# Patient Record
Sex: Female | Born: 1970 | Race: White | Hispanic: No | Marital: Married | Smoking: Former smoker
Health system: Southern US, Community
[De-identification: ages and names within clinical notes are randomized; demographics above are authoritative.]

## PROBLEM LIST (undated history)

## (undated) DIAGNOSIS — T7840XA Allergy, unspecified, initial encounter: Secondary | ICD-10-CM

## (undated) DIAGNOSIS — K760 Fatty (change of) liver, not elsewhere classified: Secondary | ICD-10-CM

## (undated) DIAGNOSIS — J45909 Unspecified asthma, uncomplicated: Secondary | ICD-10-CM

## (undated) DIAGNOSIS — I10 Essential (primary) hypertension: Secondary | ICD-10-CM

## (undated) HISTORY — DX: Unspecified asthma, uncomplicated: J45.909

---

## 2000-05-03 ENCOUNTER — Other Ambulatory Visit: Admission: RE | Admit: 2000-05-03 | Discharge: 2000-05-03 | Payer: Self-pay | Admitting: *Deleted

## 2000-10-11 ENCOUNTER — Encounter: Admission: RE | Admit: 2000-10-11 | Discharge: 2000-10-11 | Payer: Self-pay | Admitting: Obstetrics and Gynecology

## 2000-10-11 ENCOUNTER — Encounter: Payer: Self-pay | Admitting: Obstetrics and Gynecology

## 2000-11-14 ENCOUNTER — Inpatient Hospital Stay (HOSPITAL_COMMUNITY): Admission: AD | Admit: 2000-11-14 | Discharge: 2000-11-17 | Payer: Self-pay | Admitting: Obstetrics and Gynecology

## 2000-11-18 ENCOUNTER — Encounter: Admission: RE | Admit: 2000-11-18 | Discharge: 2000-12-18 | Payer: Self-pay | Admitting: Obstetrics and Gynecology

## 2000-12-15 ENCOUNTER — Other Ambulatory Visit: Admission: RE | Admit: 2000-12-15 | Discharge: 2000-12-15 | Payer: Self-pay | Admitting: Obstetrics and Gynecology

## 2002-02-14 ENCOUNTER — Other Ambulatory Visit: Admission: RE | Admit: 2002-02-14 | Discharge: 2002-02-14 | Payer: Self-pay | Admitting: Obstetrics & Gynecology

## 2003-04-23 ENCOUNTER — Other Ambulatory Visit: Admission: RE | Admit: 2003-04-23 | Discharge: 2003-04-23 | Payer: Self-pay | Admitting: Obstetrics and Gynecology

## 2007-06-11 ENCOUNTER — Ambulatory Visit: Payer: Self-pay | Admitting: Family Medicine

## 2008-08-15 ENCOUNTER — Ambulatory Visit: Payer: Self-pay | Admitting: Family Medicine

## 2010-06-28 ENCOUNTER — Ambulatory Visit: Payer: Self-pay | Admitting: Cardiology

## 2010-11-29 LAB — HM PAP SMEAR: HM Pap smear: NORMAL

## 2012-11-28 ENCOUNTER — Encounter: Payer: Self-pay | Admitting: Internal Medicine

## 2012-11-28 ENCOUNTER — Ambulatory Visit (INDEPENDENT_AMBULATORY_CARE_PROVIDER_SITE_OTHER): Payer: 59 | Admitting: Internal Medicine

## 2012-11-28 VITALS — BP 132/80 | HR 110 | Temp 98.5°F | Ht 64.0 in | Wt 197.0 lb

## 2012-11-28 DIAGNOSIS — R000 Tachycardia, unspecified: Secondary | ICD-10-CM

## 2012-11-28 DIAGNOSIS — J4520 Mild intermittent asthma, uncomplicated: Secondary | ICD-10-CM

## 2012-11-28 DIAGNOSIS — J45909 Unspecified asthma, uncomplicated: Secondary | ICD-10-CM

## 2012-11-28 DIAGNOSIS — Z1239 Encounter for other screening for malignant neoplasm of breast: Secondary | ICD-10-CM

## 2012-11-28 NOTE — Assessment & Plan Note (Signed)
Tachycardia noted on exam today. EKG shows sinus tachycardia. She reports a history of menorrhagia and question whether anemia/iron deficiency may be contributing to tachycardia. Will check labs today including CBC, ferritin, TSH, CMP.

## 2012-11-28 NOTE — Assessment & Plan Note (Signed)
Symptoms well controlled with albuterol as needed. Will continue.

## 2012-11-28 NOTE — Progress Notes (Signed)
Subjective:    Patient ID: Meghan Osborne, female    DOB: 03/07/1971, 42 y.o.   MRN: [id number]  HPI 42 year old female with history of asthma presents to establish care. She reports she is generally feeling well. Her asthma is well controlled and she only rarely needs to use albuterol. She has not had any recent shortness of breath, wheezing, chest pain.  She is concerned today about rapid heart rate. She has been told by several physicians that she has an elevated heart rate. Her former PCP completed an EKG which she reports was normal. She denies any sensation of palpitations, chest pain, shortness of breath. She denies any exercise intolerance. She reports that her heart rate has been elevated, near 100 for years. No personal or family history of arrhythmia.   Outpatient Encounter Prescriptions as of 11/28/2012  Medication Sig Dispense Refill  . albuterol (PROAIR HFA) 108 (90 BASE) MCG/ACT inhaler Inhale 2 puffs into the lungs every 6 (six) hours as needed for wheezing.       No facility-administered encounter medications on file as of 11/28/2012.   BP 132/80  Pulse 110  Temp(Src) 98.5 F (36.9 C) (Oral)  Ht 5\' 4"  (1.626 m)  Wt 197 lb (89.359 kg)  BMI 33.8 kg/m2  SpO2 95%  LMP 11/14/2012  Review of Systems  Constitutional: Negative for fever, chills, appetite change, fatigue and unexpected weight change.  HENT: Negative for ear pain, congestion, sore throat, trouble swallowing, neck pain, voice change and sinus pressure.   Eyes: Negative for visual disturbance.  Respiratory: Negative for cough, shortness of breath, wheezing and stridor.   Cardiovascular: Negative for chest pain, palpitations and leg swelling.  Gastrointestinal: Negative for nausea, vomiting, abdominal pain, diarrhea, constipation, blood in stool, abdominal distention and anal bleeding.  Genitourinary: Negative for dysuria and flank pain.  Musculoskeletal: Negative for myalgias, arthralgias and gait problem.  Skin:  Negative for color change and rash.  Neurological: Negative for dizziness and headaches.  Hematological: Negative for adenopathy. Does not bruise/bleed easily.  Psychiatric/Behavioral: Negative for suicidal ideas, sleep disturbance and dysphoric mood. The patient is not nervous/anxious.        Objective:   Physical Exam  Constitutional: She is oriented to person, place, and time. She appears well-developed and well-nourished. No distress.  HENT:  Head: Normocephalic and atraumatic.  Right Ear: External ear normal.  Left Ear: External ear normal.  Nose: Nose normal.  Mouth/Throat: Oropharynx is clear and moist. No oropharyngeal exudate.  Eyes: Conjunctivae are normal. Pupils are equal, round, and reactive to light. Right eye exhibits no discharge. Left eye exhibits no discharge. No scleral icterus.  Neck: Normal range of motion. Neck supple. No tracheal deviation present. No thyromegaly present.  Cardiovascular: Regular rhythm, normal heart sounds and intact distal pulses.  Tachycardia present.  Exam reveals no gallop and no friction rub.   No murmur heard. Pulmonary/Chest: Effort normal and breath sounds normal. No respiratory distress. She has no wheezes. She has no rales. She exhibits no tenderness.  Musculoskeletal: Normal range of motion. She exhibits no edema and no tenderness.  Lymphadenopathy:    She has no cervical adenopathy.  Neurological: She is alert and oriented to person, place, and time. No cranial nerve deficit. She exhibits normal muscle tone. Coordination normal.  Skin: Skin is warm and dry. No rash noted. She is not diaphoretic. No erythema. No pallor.  Psychiatric: She has a normal mood and affect. Her behavior is normal. Judgment and thought content normal.  Assessment & Plan:

## 2012-11-29 LAB — LIPID PANEL
Cholesterol: 142 mg/dL (ref 0–200)
HDL: 43.1 mg/dL (ref >39.00)
LDL Cholesterol: 68 mg/dL (ref 0–99)
Total CHOL/HDL Ratio: 3
Triglycerides: 155 mg/dL — ABNORMAL HIGH (ref 0.0–149.0)
VLDL: 31 mg/dL (ref 0.0–40.0)

## 2012-11-29 LAB — COMPREHENSIVE METABOLIC PANEL
ALT: 22 U/L (ref 0–35)
AST: 21 U/L (ref 0–37)
Albumin: 4 g/dL (ref 3.5–5.2)
Alkaline Phosphatase: 60 U/L (ref 39–117)
BUN: 11 mg/dL (ref 6–23)
CO2: 30 mEq/L (ref 19–32)
Calcium: 9.2 mg/dL (ref 8.4–10.5)
Chloride: 103 mEq/L (ref 96–112)
Creatinine, Ser: 0.7 mg/dL (ref 0.4–1.2)
GFR: 90.96 mL/min (ref >60.00)
Glucose, Bld: 81 mg/dL (ref 70–99)
Potassium: 3.9 mEq/L (ref 3.5–5.1)
Sodium: 139 mEq/L (ref 135–145)
Total Bilirubin: 0.4 mg/dL (ref 0.3–1.2)
Total Protein: 7 g/dL (ref 6.0–8.3)

## 2012-11-29 LAB — CBC WITH DIFFERENTIAL/PLATELET
Basophils Absolute: 0 10*3/uL (ref 0.0–0.1)
Basophils Relative: 0.4 % (ref 0.0–3.0)
Eosinophils Absolute: 0.1 10*3/uL (ref 0.0–0.7)
Eosinophils Relative: 1.5 % (ref 0.0–5.0)
HCT: 38.5 % (ref 36.0–46.0)
Hemoglobin: 12.8 g/dL (ref 12.0–15.0)
Lymphocytes Relative: 31 % (ref 12.0–46.0)
Lymphs Abs: 2.9 10*3/uL (ref 0.7–4.0)
MCHC: 33.4 g/dL (ref 30.0–36.0)
MCV: 88.2 fl (ref 78.0–100.0)
Monocytes Absolute: 0.4 10*3/uL (ref 0.1–1.0)
Monocytes Relative: 4.4 % (ref 3.0–12.0)
Neutro Abs: 6 10*3/uL (ref 1.4–7.7)
Neutrophils Relative %: 62.7 % (ref 43.0–77.0)
Platelets: 219 10*3/uL (ref 150.0–400.0)
RBC: 4.36 Mil/uL (ref 3.87–5.11)
RDW: 13.1 % (ref 11.5–14.6)
WBC: 9.5 10*3/uL (ref 4.5–10.5)

## 2012-11-29 LAB — FERRITIN: Ferritin: 27.2 ng/mL (ref 10.0–291.0)

## 2012-11-29 LAB — TSH: TSH: 1.71 u[IU]/mL (ref 0.35–5.50)

## 2012-12-03 NOTE — Addendum Note (Signed)
Addended by: Ronna Polio A on: 12/03/2012 11:51 AM   Modules accepted: Orders

## 2012-12-11 ENCOUNTER — Ambulatory Visit: Payer: 59 | Admitting: Cardiovascular Disease

## 2013-01-01 ENCOUNTER — Encounter: Payer: 59 | Admitting: Physician Assistant

## 2013-02-28 ENCOUNTER — Encounter: Payer: Self-pay | Admitting: Internal Medicine

## 2013-02-28 ENCOUNTER — Other Ambulatory Visit: Payer: Self-pay

## 2013-03-05 ENCOUNTER — Ambulatory Visit: Payer: 59 | Admitting: Internal Medicine

## 2013-07-02 ENCOUNTER — Ambulatory Visit: Payer: 59 | Admitting: Internal Medicine

## 2013-12-18 ENCOUNTER — Ambulatory Visit (INDEPENDENT_AMBULATORY_CARE_PROVIDER_SITE_OTHER): Payer: 59 | Admitting: Adult Health

## 2013-12-18 ENCOUNTER — Encounter: Payer: Self-pay | Admitting: Adult Health

## 2013-12-18 VITALS — BP 109/77 | HR 108 | Temp 97.6°F | Resp 14 | Wt 196.5 lb

## 2013-12-18 DIAGNOSIS — R000 Tachycardia, unspecified: Secondary | ICD-10-CM

## 2013-12-18 DIAGNOSIS — R42 Dizziness and giddiness: Secondary | ICD-10-CM

## 2013-12-18 DIAGNOSIS — J0110 Acute frontal sinusitis, unspecified: Secondary | ICD-10-CM

## 2013-12-18 NOTE — Patient Instructions (Signed)
  Start flonase 2 sprays into each nostril daily.  Drink fluids to maintain hydration.  Do not take any decongestants.  You may try dramamine for the dizziness.  If your symptoms are not improved within 1 week please call.  I am re-referring you to see Dr. Rockey Situ for your persistent tachycardia. We will call you with an appointment.

## 2013-12-18 NOTE — Progress Notes (Signed)
Patient ID: Meghan Osborne, female   DOB: 01-12-1971, 43 y.o.   MRN: [id number]    Subjective:    Patient ID: Meghan Osborne, female    DOB: 09-Jul-1970, 43 y.o.   MRN: [id number]  HPI  Patient is a 43 year old female who presents to clinic with sinus congestion, pressure. She has been taking mucinex D but has not really noticed any improvement. She is also experiencing dizziness (room spinning) with rapid head movements and nausea. Symptoms started yesterday. No fever or chills  Past Medical History  Diagnosis Date  . Asthma     Current Outpatient Prescriptions on File Prior to Visit  Medication Sig Dispense Refill  . albuterol (PROAIR HFA) 108 (90 BASE) MCG/ACT inhaler Inhale 2 puffs into the lungs every 6 (six) hours as needed for wheezing.       No current facility-administered medications on file prior to visit.     Review of Systems  Constitutional: Negative for fever and chills.  HENT: Positive for postnasal drip and sinus pressure. Negative for rhinorrhea, sneezing and sore throat.   Respiratory: Negative.  Negative for shortness of breath.   Cardiovascular: Negative for chest pain and palpitations.  Neurological: Positive for dizziness (room spinning with rapid head movement).       Objective:  BP 109/77  Pulse 108  Temp(Src) 97.6 F (36.4 C) (Oral)  Resp 14  Wt 196 lb 8 oz (89.132 kg)  SpO2 99%   Physical Exam  Constitutional: She is oriented to person, place, and time. She appears well-developed and well-nourished. No distress.  HENT:  Head: Normocephalic and atraumatic.  Right Ear: External ear normal.  Left Ear: External ear normal.  Mouth/Throat: Oropharynx is clear and moist. No oropharyngeal exudate.  Cardiovascular: Regular rhythm and normal heart sounds.  Exam reveals no gallop.   No murmur heard. tachycardia  Pulmonary/Chest: Effort normal and breath sounds normal. No respiratory distress. She has no wheezes. She has no rales.  Lymphadenopathy:      She has no cervical adenopathy.  Neurological: She is alert and oriented to person, place, and time.  Psychiatric: She has a normal mood and affect. Her behavior is normal. Judgment and thought content normal.      Assessment & Plan:   1. Acute frontal sinusitis, recurrence not specified No indication for antibiotic at this time. Flonase 2 sprays in each nostril daily. Do not take decongestants 2/2 tachycardia. Drink plenty of fluids.  2. Tachycardia Noted pt has been tachycardic on several visit. She was referred to Dr. Rockey Situ by her PCP back in August but pt never followed up. I discussed importance of getting this evaluated. Did not hear any murmurs, gallops. She is agreeable to get this evaluated. Advised to stop the Mucinex D. - Ambulatory referral to Cardiology  3. Dizziness Suspect BPPV. She will try dramamine or other motion sickness OTC medication. Stay hydrated. Medication will cause sedation. Avoid driving. RTC if no improvement within 1 week or sooner if necessary.

## 2013-12-26 ENCOUNTER — Encounter: Payer: Self-pay | Admitting: Internal Medicine

## 2013-12-26 MED ORDER — ALBUTEROL SULFATE HFA 108 (90 BASE) MCG/ACT IN AERS: 2.0000 | INHALATION_SPRAY | Freq: Four times a day (QID) | RESPIRATORY_TRACT | Status: AC | PRN

## 2014-01-07 ENCOUNTER — Encounter: Payer: Self-pay | Admitting: Internal Medicine

## 2014-01-07 ENCOUNTER — Ambulatory Visit (INDEPENDENT_AMBULATORY_CARE_PROVIDER_SITE_OTHER)
Admission: RE | Admit: 2014-01-07 | Discharge: 2014-01-07 | Disposition: A | Discharge status: Home / Self Care | Payer: 59 | Source: Ambulatory Visit | Attending: Internal Medicine | Admitting: Internal Medicine

## 2014-01-07 ENCOUNTER — Ambulatory Visit (INDEPENDENT_AMBULATORY_CARE_PROVIDER_SITE_OTHER): Payer: 59 | Admitting: Internal Medicine

## 2014-01-07 ENCOUNTER — Other Ambulatory Visit: Payer: Self-pay | Admitting: Internal Medicine

## 2014-01-07 VITALS — BP 118/80 | HR 100 | Temp 97.9°F | Wt 194.0 lb

## 2014-01-07 DIAGNOSIS — R059 Cough, unspecified: Secondary | ICD-10-CM

## 2014-01-07 DIAGNOSIS — R05 Cough: Secondary | ICD-10-CM

## 2014-01-07 DIAGNOSIS — R0602 Shortness of breath: Secondary | ICD-10-CM

## 2014-01-07 DIAGNOSIS — R062 Wheezing: Secondary | ICD-10-CM

## 2014-01-07 MED ORDER — AZITHROMYCIN 250 MG PO TABS: ORAL_TABLET | ORAL | Status: AC

## 2014-01-07 NOTE — Progress Notes (Signed)
Pre visit review using our clinic review tool, if applicable. No additional management support is needed unless otherwise documented below in the visit note. 

## 2014-01-07 NOTE — Progress Notes (Signed)
HPI  Pt presents to the clinic today with c/o cough, chest congestion, shortness of breath, wheezing and fatigue. She reports this started 2 weeks ago. The cough is not productive. She denies fever, chills or body aches. She does have a history of asthma. She was seen at the walk in clinic 1 week ago for the same. She was given a prednisone taper, which she has finished with no relief in her symptoms. She is also taking her albuterol without any relief.  Review of Systems    Past Medical History  Diagnosis Date  . Asthma     Family History  Problem Relation Age of Onset  . Heart disease Mother   . Alcohol abuse Mother   . Diabetes Father   . Hyperlipidemia Father   . Heart disease Father   . Cancer Other     breast, paternal great grandmother    History   Social History  . Marital Status: Married    Spouse Name: N/A    Number of Children: N/A  . Years of Education: N/A   Occupational History  . Not on file.   Social History Main Topics  . Smoking status: Former Smoker    Types: Cigarettes    Quit date: 11/29/1991  . Smokeless tobacco: Never Used  . Alcohol Use: Yes     Comment: Socially  . Drug Use: No  . Sexual Activity: Not on file   Other Topics Concern  . Not on file   Social History Narrative   Lives in [place] with husband and daughter. Cat in home. 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      Work - Chemical engineer, Medical illustrator      Diet - regular      Exercise - limited    Allergies  Allergen Reactions  . Tamiflu [Oseltamivir Phosphate] Rash     Constitutional:  Denies headache, fatigue, fever or abrupt weight changes.  HEENT:  . Denies eye redness, ear pain, ringing in the ears, wax buildup, runny nose or sore throat. Respiratory: Positive cough and shortness of breath. Denies difficulty breathing.  Cardiovascular: Denies chest pain, chest tightness, palpitations or swelling in the hands or feet.   No other specific complaints in a complete review  of systems (except as listed in HPI above).  Objective:   BP 118/80  Pulse 100  Temp(Src) 97.9 F (36.6 C) (Oral)  Wt 194 lb (87.998 kg)  SpO2 96%  General: Appears her stated age, well developed, well nourished in NAD. HEENT: Ears: Tm's gray and intact, normal light reflex; Nose: mucosa pink and moist, septum midline; Throat/Mouth: Teeth present, mucosa pink and moist, no exudate noted, no lesions or ulcerations noted.   Cardiovascular: Tachycardic with normal rhythm. S1,S2 noted.  No murmur, rubs or gallops noted. No BLE edema.  Pulmonary/Chest: Normal effort and crackles noted bilaterally. No respiratory distress. No wheezes r ronchi noted.      Assessment & Plan:   Cough, wheezing and shortness of breath:  ? Fluid versus infiltrate (however, no fever, no colored mucous, no BLE) Will check chest xray If fluid- will treat with diuretic If infiltrate- will treat with Levaquin If normal- no intervention needed  Will call you after chest xray is back with results and comprehensive plan

## 2014-01-07 NOTE — Patient Instructions (Addendum)
Cough, Adult  A cough is a reflex that helps clear your throat and airways. It can help heal the body or may be a reaction to an irritated airway. A cough may only last 2 or 3 weeks (acute) or may last more than 8 weeks (chronic).  CAUSES Acute cough:  Viral or bacterial infections. Chronic cough:  Infections.  Allergies.  Asthma.  Post-nasal drip.  Smoking.  Heartburn or acid reflux.  Some medicines.  Chronic lung problems (COPD).  Cancer. SYMPTOMS   Cough.  Fever.  Chest pain.  Increased breathing rate.  High-pitched whistling sound when breathing (wheezing).  Colored mucus that you cough up (sputum). TREATMENT   A bacterial cough may be treated with antibiotic medicine.  A viral cough must run its course and will not respond to antibiotics.  Your caregiver may recommend other treatments if you have a chronic cough. HOME CARE INSTRUCTIONS   Only take over-the-counter or prescription medicines for pain, discomfort, or fever as directed by your caregiver. Use cough suppressants only as directed by your caregiver.  Use a cold steam vaporizer or humidifier in your bedroom or home to help loosen secretions.  Sleep in a semi-upright position if your cough is worse at night.  Rest as needed.  Stop smoking if you smoke. SEEK IMMEDIATE MEDICAL CARE IF:   You have pus in your sputum.  Your cough starts to worsen.  You cannot control your cough with suppressants and are losing sleep.  You begin coughing up blood.  You have difficulty breathing.  You develop pain which is getting worse or is uncontrolled with medicine.  You have a fever. MAKE SURE YOU:   Understand these instructions.  Will watch your condition.  Will get help right away if you are not doing well or get worse. Document Released: 10/08/2010 Document Revised: 07/04/2011 Document Reviewed: 10/08/2010 Exit[employer] Patient Information 2015 [employer]. This information is not intended  to replace advice given to you by your health care provider. Make sure you discuss any questions you have with your health care provider.  

## 2014-01-17 ENCOUNTER — Ambulatory Visit: Payer: 59 | Admitting: Cardiovascular Disease

## 2014-01-29 ENCOUNTER — Ambulatory Visit: Payer: 59 | Admitting: Cardiovascular Disease

## 2019-06-28 ENCOUNTER — Other Ambulatory Visit: Payer: Self-pay

## 2019-06-28 ENCOUNTER — Emergency Department
Admission: EM | Admit: 2019-06-28 | Discharge: 2019-06-28 | Disposition: A | Discharge status: Home / Self Care | Payer: BC Managed Care – PPO | Attending: Emergency Medicine | Admitting: Emergency Medicine

## 2019-06-28 DIAGNOSIS — J45909 Unspecified asthma, uncomplicated: Secondary | ICD-10-CM | POA: Diagnosis not present

## 2019-06-28 DIAGNOSIS — R109 Unspecified abdominal pain: Secondary | ICD-10-CM

## 2019-06-28 DIAGNOSIS — Z87891 Personal history of nicotine dependence: Secondary | ICD-10-CM | POA: Diagnosis not present

## 2019-06-28 DIAGNOSIS — Z79899 Other long term (current) drug therapy: Secondary | ICD-10-CM | POA: Diagnosis not present

## 2019-06-28 HISTORY — DX: Allergy, unspecified, initial encounter: T78.40XA

## 2019-06-28 LAB — URINALYSIS, COMPLETE (UACMP) WITH MICROSCOPIC
Bacteria, UA: NONE SEEN
Bilirubin Urine: NEGATIVE
Glucose, UA: NEGATIVE mg/dL
Ketones, ur: NEGATIVE mg/dL
Leukocytes,Ua: NEGATIVE
Nitrite: NEGATIVE
Protein, ur: 30 mg/dL — AB
RBC / HPF: >50 RBC/hpf — ABNORMAL HIGH (ref 0–5)
Specific Gravity, Urine: 1.018 (ref 1.005–1.030)
pH: 5 (ref 5.0–8.0)

## 2019-06-28 LAB — COMPREHENSIVE METABOLIC PANEL
ALT: 55 U/L — ABNORMAL HIGH (ref 0–44)
AST: 56 U/L — ABNORMAL HIGH (ref 15–41)
Albumin: 4.5 g/dL (ref 3.5–5.0)
Alkaline Phosphatase: 62 U/L (ref 38–126)
Anion gap: 8 (ref 5–15)
BUN: 15 mg/dL (ref 6–20)
CO2: 26 mmol/L (ref 22–32)
Calcium: 10 mg/dL (ref 8.9–10.3)
Chloride: 101 mmol/L (ref 98–111)
Creatinine, Ser: 0.72 mg/dL (ref 0.44–1.00)
GFR calc Af Amer: >60 mL/min (ref >60)
GFR calc non Af Amer: >60 mL/min (ref >60)
Glucose, Bld: 105 mg/dL — ABNORMAL HIGH (ref 70–99)
Potassium: 3.8 mmol/L (ref 3.5–5.1)
Sodium: 135 mmol/L (ref 135–145)
Total Bilirubin: 0.6 mg/dL (ref 0.3–1.2)
Total Protein: 7.9 g/dL (ref 6.5–8.1)

## 2019-06-28 LAB — CBC WITH DIFFERENTIAL/PLATELET
Abs Immature Granulocytes: 0.02 10*3/uL (ref 0.00–0.07)
Basophils Absolute: 0 10*3/uL (ref 0.0–0.1)
Basophils Relative: 0 %
Eosinophils Absolute: 0.1 10*3/uL (ref 0.0–0.5)
Eosinophils Relative: 2 %
HCT: 42.3 % (ref 36.0–46.0)
Hemoglobin: 13.9 g/dL (ref 12.0–15.0)
Immature Granulocytes: 0 %
Lymphocytes Relative: 27 %
Lymphs Abs: 2.1 10*3/uL (ref 0.7–4.0)
MCH: 28.1 pg (ref 26.0–34.0)
MCHC: 32.9 g/dL (ref 30.0–36.0)
MCV: 85.6 fL (ref 80.0–100.0)
Monocytes Absolute: 0.4 10*3/uL (ref 0.1–1.0)
Monocytes Relative: 4 %
Neutro Abs: 5.4 10*3/uL (ref 1.7–7.7)
Neutrophils Relative %: 67 %
Platelets: 243 10*3/uL (ref 150–400)
RBC: 4.94 MIL/uL (ref 3.87–5.11)
RDW: 13.6 % (ref 11.5–15.5)
WBC: 8 10*3/uL (ref 4.0–10.5)
nRBC: 0 % (ref 0.0–0.2)

## 2019-06-28 LAB — LIPASE, BLOOD: Lipase: 46 U/L (ref 11–51)

## 2019-06-28 MED ORDER — FAMOTIDINE 20 MG PO TABS: 20.0000 mg | ORAL_TABLET | Freq: Every day | ORAL | 1 refills | Status: AC

## 2019-06-28 MED ORDER — LIDOCAINE VISCOUS HCL 2 % MT SOLN
15.0000 mL | Freq: Once | OROMUCOSAL | Status: AC
  Administered 2019-06-28: 15 mL via OROMUCOSAL
  Filled 2019-06-28: qty 15

## 2019-06-28 MED ORDER — SUCRALFATE 1 G PO TABS: 1.0000 g | ORAL_TABLET | Freq: Four times a day (QID) | ORAL | 0 refills | Status: AC

## 2019-06-28 NOTE — ED Triage Notes (Signed)
Pt to the er for abd pain and back pain that started last night that pt described as menstrual cramps. Pt has not had a period in 6 months. Pt states the pain has worsened today. Pt reports pain and burning with urination.

## 2019-06-28 NOTE — ED Notes (Signed)
Pt to ED c/o mid abdominal pain that radiated to flank at 1500 today. Denies chest pain, SHOB, dizziness.  Denies pain or burning with urination at this time.  Denies fevers, N/V/D Pt in NAD at this time

## 2019-06-28 NOTE — Discharge Instructions (Addendum)
As we discussed please follow up with your doctor. If you do not develop your period in the next day or two it would be important that you get close follow up for the blood seen in your urine today. Please seek medical attention for any high fevers, chest pain, shortness of breath, change in behavior, persistent vomiting, bloody stool or any other new or concerning symptoms.

## 2019-06-28 NOTE — ED Provider Notes (Signed)
[facility] Emergency Department Provider Note   ____________________________________________   I have reviewed the triage vital signs and the nursing notes.   HISTORY  Chief Complaint Abdominal Pain   History limited by: Not Limited   HPI Meghan Osborne is a 49 y.o. female who presents to the emergency department today because of concern for abdominal pain. The pain started today around 3 pm while she was sitting down at work. She says that the pain was located somewhat centrally and in the lower abdomen. The pain was severe. It was accompanied by some back pain. She denies any significant nausea although when the pain got very severe she did feel like she might have to vomit. The patient denies similar pain in the past. By the time of my exam the pain had eased off significantly although she still had some burning in the upper mid abdomen. Denies any fevers. Denies any unusual ingestions.   Records reviewed. Per medical record review patient has a history of asthma.   Past Medical History:  Diagnosis Date  . Allergies   . Asthma     Patient Active Problem List   Diagnosis Date Noted  . Tachycardia 11/28/2012  . Screening for breast cancer 11/28/2012  . Asthma, chronic 11/28/2012    Past Surgical History:  Procedure Laterality Date  . VAGINAL DELIVERY     2    Prior to Admission medications   Medication Sig Start Date End Date Taking? Authorizing Provider  albuterol (PROAIR HFA) 108 (90 BASE) MCG/ACT inhaler Inhale 2 puffs into the lungs every 6 (six) hours as needed for wheezing. 12/26/13   Jackolyn Confer, MD  azithromycin (ZITHROMAX) 250 MG tablet Take 2 tablets today, then 1 tablet daily for 4 days 01/07/14   Jearld Fenton, NP    Allergies Oseltamivir and Tamiflu [oseltamivir phosphate]  Family History  Problem Relation Age of Onset  . Heart disease Mother   . Alcohol abuse Mother   . Diabetes Father   . Hyperlipidemia Father   .  Heart disease Father   . Cancer Other        breast, paternal great grandmother    Social History Social History   Tobacco Use  . Smoking status: Former Smoker    Types: Cigarettes    Quit date: 11/29/1991    Years since quitting: 27.5  . Smokeless tobacco: Never Used  Substance Use Topics  . Alcohol use: Yes    Comment: Socially  . Drug use: No    Review of Systems Constitutional: No fever/chills Eyes: No visual changes. ENT: No sore throat. Cardiovascular: Denies chest pain. Respiratory: Denies shortness of breath. Gastrointestinal: Positive for abdominal pain. Genitourinary: Negative for dysuria. Musculoskeletal: Negative for back pain. Skin: Negative for rash. Neurological: Negative for headaches, focal weakness or numbness.  ____________________________________________   PHYSICAL EXAM:  VITAL SIGNS: ED Triage Vitals  Enc Vitals Group     BP 06/28/19 1801 (!) 135/95     Pulse Rate 06/28/19 1801 86     Resp 06/28/19 1801 18     Temp 06/28/19 1801 97.7 F (36.5 C)     Temp Source 06/28/19 1801 Oral     SpO2 06/28/19 1801 98 %     Weight 06/28/19 1829 208 lb (94.3 kg)     Height 06/28/19 1829 5\' 4"  (1.626 m)     Head Circumference --      Peak Flow --      Pain Score 06/28/19  1829 2   Constitutional: Alert and oriented.  Eyes: Conjunctivae are normal.  ENT      Head: Normocephalic and atraumatic.      Nose: No congestion/rhinnorhea.      Mouth/Throat: Mucous membranes are moist.      Neck: No stridor. Hematological/Lymphatic/Immunilogical: No cervical lymphadenopathy. Cardiovascular: Normal rate, regular rhythm.  No murmurs, rubs, or gallops.  Respiratory: Normal respiratory effort without tachypnea nor retractions. Breath sounds are clear and equal bilaterally. No wheezes/rales/rhonchi. Gastrointestinal: Soft and non tender. No rebound. No guarding.  Genitourinary: Deferred Musculoskeletal: Normal range of motion in all extremities. No lower extremity  edema. Neurologic:  Normal speech and language. No gross focal neurologic deficits are appreciated.  Skin:  Skin is warm, dry and intact. No rash noted. Psychiatric: Mood and affect are normal. Speech and behavior are normal. Patient exhibits appropriate insight and judgment.  ____________________________________________    LABS (pertinent positives/negatives)  CBC wbc 8.0, hgb 13.9, plt 243 UA hazy, large hgb dipstick, >50 rbc, 0-5 wbc Lipase 46 CMP wnl except glu 105, ast 56, alt 55 ____________________________________________   EKG  I, Nance Pear, attending physician, personally viewed and interpreted this EKG  EKG Time: 1821 Rate: 88 Rhythm: normal sinus rhythm Axis: left axis deviation Intervals: qtc 467 QRS: narrow ST changes: no st elevation Impression: abnormal ekg   ____________________________________________    RADIOLOGY  None  ____________________________________________   PROCEDURES  Procedures  ____________________________________________   INITIAL IMPRESSION / ASSESSMENT AND PLAN / ED COURSE  Pertinent labs & imaging results that were available during my care of the patient were reviewed by me and considered in my medical decision making (see chart for details).   Patient presented to the emergency department today because of concerns for abdominal pain.  Patient's abdominal exam without any significant tenderness.  Work-up without any leukocytosis.  Urine did have red blood cells in it.  I discussed this with the patient.  She is unsure if she might be beginning to start her period and that this could have been some cramping.  She states that her periods are now irregular.  I do not think the patient's clinical history is consistent with kidney stone.  I did discuss with patient possibility of obtaining imaging however she was comfortable deferring.  We did discuss that if her pain does not improve she will need to follow-up with primary care  to reevaluate.  Also discussed if symptoms worsen to come back to the emergency department.  She did get some relief with GI cocktail so will treat for gastritis.  ___________________________________________   FINAL CLINICAL IMPRESSION(S) / ED DIAGNOSES  Final diagnoses:  Abdominal pain, unspecified abdominal location     Note: This dictation was prepared with Dragon dictation. Any transcriptional errors that result from this process are unintentional     Nance Pear, MD 06/28/19 2340

## 2019-07-09 ENCOUNTER — Other Ambulatory Visit: Payer: Self-pay | Admitting: Internal Medicine

## 2019-07-09 DIAGNOSIS — R1084 Generalized abdominal pain: Secondary | ICD-10-CM

## 2019-07-15 ENCOUNTER — Other Ambulatory Visit: Payer: Self-pay

## 2019-07-15 ENCOUNTER — Ambulatory Visit
Admission: RE | Admit: 2019-07-15 | Discharge: 2019-07-15 | Disposition: A | Discharge status: Home / Self Care | Payer: BC Managed Care – PPO | Source: Ambulatory Visit | Attending: Internal Medicine | Admitting: Internal Medicine

## 2019-07-15 DIAGNOSIS — R1084 Generalized abdominal pain: Secondary | ICD-10-CM

## 2019-07-17 ENCOUNTER — Other Ambulatory Visit: Payer: Self-pay

## 2019-07-17 ENCOUNTER — Ambulatory Visit
Admission: RE | Admit: 2019-07-17 | Discharge: 2019-07-17 | Disposition: A | Discharge status: Home / Self Care | Payer: BC Managed Care – PPO | Source: Ambulatory Visit | Attending: Internal Medicine | Admitting: Internal Medicine

## 2019-07-17 DIAGNOSIS — R1084 Generalized abdominal pain: Secondary | ICD-10-CM | POA: Insufficient documentation

## 2020-04-02 ENCOUNTER — Other Ambulatory Visit: Payer: Self-pay

## 2020-04-02 ENCOUNTER — Other Ambulatory Visit: Payer: Self-pay | Admitting: Family Medicine

## 2020-04-02 ENCOUNTER — Ambulatory Visit
Admission: RE | Admit: 2020-04-02 | Discharge: 2020-04-02 | Disposition: A | Discharge status: Home / Self Care | Payer: BC Managed Care – PPO | Source: Ambulatory Visit | Attending: Family Medicine | Admitting: Family Medicine

## 2020-04-02 DIAGNOSIS — R1032 Left lower quadrant pain: Secondary | ICD-10-CM | POA: Insufficient documentation

## 2020-04-02 MED ORDER — IOHEXOL 300 MG/ML  SOLN
100.0000 mL | Freq: Once | INTRAMUSCULAR | Status: AC | PRN
  Administered 2020-04-02: 100 mL via INTRAVENOUS

## 2020-05-09 ENCOUNTER — Other Ambulatory Visit: Payer: Self-pay

## 2020-05-09 DIAGNOSIS — Z20822 Contact with and (suspected) exposure to covid-19: Secondary | ICD-10-CM

## 2020-05-12 LAB — NOVEL CORONAVIRUS, NAA: SARS-CoV-2, NAA: DETECTED — AB

## 2020-08-12 ENCOUNTER — Other Ambulatory Visit: Payer: Self-pay

## 2020-08-12 ENCOUNTER — Ambulatory Visit: Payer: BC Managed Care – PPO | Admitting: Gastroenterology

## 2020-08-12 ENCOUNTER — Encounter: Payer: Self-pay | Admitting: Gastroenterology

## 2020-08-12 VITALS — BP 115/84 | HR 92 | Ht 63.5 in | Wt 217.0 lb

## 2020-08-12 DIAGNOSIS — Z8719 Personal history of other diseases of the digestive system: Secondary | ICD-10-CM

## 2020-08-12 MED ORDER — PEG 3350-KCL-NA BICARB-NACL 420 G PO SOLR: 4000.0000 mL | Freq: Once | ORAL | 0 refills | Status: AC

## 2020-08-12 NOTE — Progress Notes (Signed)
Procedure has been scheduled and instructions sent via my chart and mailed. Bowel prep sent to the pharmacy.

## 2020-08-12 NOTE — Progress Notes (Signed)
Jonathon Bellows MD, MRCP(U.K) [address]  [place]  [place], [place] [address]  Main: [phone]  Fax: [phone]   Gastroenterology Consultation  Referring Provider:     Idelle Crouch, MD Primary Care Physician:  Idelle Crouch, MD Primary Gastroenterologist:  Dr. Jonathon Bellows  Reason for Consultation:     Diverticulosis of the colon        HPI:   Meghan Osborne is a 50 y.o. y/o female referred for consultation & management  by Dr. Idelle Crouch, MD.   December 2020 when she had presented with symptoms suggestive of diverticulitis which was confirmed on CT scan as below.  Treated with ciprofloxacin and Flagyl and her symptoms of abdominal discomfort gradually resolved over 3 months.  Presently has no GI symptoms.  No abdominal pain.  Never had a colonoscopy.  No family history of colon cancer or polyps.  No change in the shape of her stool.  No rectal bleeding.  No unintentional weight loss.  04/02/2020:  1. Moderate acute sigmoid colon diverticulitis. No extraluminal gas or abscess.2. 2 mm right mid kidney nonobstructive renal calculus. 3. Hypodense lesion in the left kidney lower pole is probably a cyst but technically too small to characterize.    Past Medical History:  Diagnosis Date  . Allergies   . Asthma     Past Surgical History:  Procedure Laterality Date  . VAGINAL DELIVERY     2    Prior to Admission medications   Medication Sig Start Date End Date Taking? Authorizing Provider  albuterol (PROAIR HFA) 108 (90 BASE) MCG/ACT inhaler Inhale 2 puffs into the lungs every 6 (six) hours as needed for wheezing. 12/26/13   Jackolyn Confer, MD  azithromycin (ZITHROMAX) 250 MG tablet Take 2 tablets today, then 1 tablet daily for 4 days 01/07/14   Jearld Fenton, NP  famotidine (PEPCID) 20 MG tablet Take 1 tablet (20 mg total) by mouth daily. 06/28/19 06/27/20  Nance Pear, MD  sucralfate (CARAFATE) 1 g tablet Take 1 tablet (1 g total) by mouth 4  (four) times daily for 10 days. 06/28/19 07/08/19  Nance Pear, MD    Family History  Problem Relation Age of Onset  . Heart disease Mother   . Alcohol abuse Mother   . Diabetes Father   . Hyperlipidemia Father   . Heart disease Father   . Cancer Other        breast, paternal great grandmother     Social History   Tobacco Use  . Smoking status: Former Smoker    Types: Cigarettes    Quit date: 11/29/1991    Years since quitting: 28.7  . Smokeless tobacco: Never Used  Substance Use Topics  . Alcohol use: Yes    Comment: Socially  . Drug use: No    Allergies as of 08/12/2020 - Review Complete 08/12/2020  Allergen Reaction Noted  . Oseltamivir Rash 04/12/2016  . Tamiflu [oseltamivir phosphate] Rash 11/28/2012    Review of Systems:    All systems reviewed and negative except where noted in HPI.   Physical Exam:  BP 115/84 (BP Location: Left Arm, Patient Position: Sitting, Cuff Size: Large)   Pulse 92   Ht 5' 3.5" (1.613 m)   Wt 217 lb (98.4 kg)   BMI 37.84 kg/m  No LMP recorded. Psych:  Alert and cooperative. Normal mood and affect. General:   Alert,  Well-developed, well-nourished, pleasant and cooperative in NAD Head:  Normocephalic and  atraumatic. Eyes:  Sclera clear, no icterus.   Conjunctiva pink. Ears:  Normal auditory acuity. Lungs:  Respirations even and unlabored.  Clear throughout to auscultation.   No wheezes, crackles, or rhonchi. No acute distress. Heart:  Regular rate and rhythm; no murmurs, clicks, rubs, or gallops. Abdomen:  Normal bowel sounds.  No bruits.  Soft, non-tender and non-distended without masses, hepatosplenomegaly or hernias noted.  No guarding or rebound tenderness.    Neurologic:  Alert and oriented x3;  grossly normal neurologically. Psych:  Alert and cooperative. Normal mood and affect.  Imaging Studies: No results found.  Assessment and Plan:   Meghan Osborne is a 50 y.o. y/o female has been referred for diverticulosis of the  colon.  She has a history of diverticulitis seen on CT scan in December 2020 one of the sigmoid colon.  She would require a colonoscopy to rule out any intraluminal lesions which could have triggered the diverticulitis.  Presently has no GI symptoms.  Plan  1. Diagnostic colonoscopy  2.  Patient information on diverticulosis provided.  Suggest high-fiber diet.  Previously diet devoid of seeds and nuts recommended but evidence subsequently has shown that there is no correlation between prevention of diverticulitis or onset of diverticulitis with consumption of seeds and nuts.  If anything there is probably a protective benefit.  I have discussed alternative options, risks & benefits,  which include, but are not limited to, bleeding, infection, perforation,respiratory complication & drug reaction.  The patient agrees with this plan & written consent will be obtained.     Follow up in as needed   Dr Jonathon Bellows MD,MRCP(U.K)

## 2020-08-12 NOTE — Patient Instructions (Signed)
Diverticulitis  Diverticulitis is when small pouches in your colon (large intestine) get infected or swollen. This causes pain in the belly (abdomen) and watery poop (diarrhea). These pouches are called diverticula. The pouches form in people who have a condition called diverticulosis. What are the causes? This condition may be caused by poop (stool) that gets trapped in the pouches in your colon. The poop lets germs (bacteria) grow in the pouches. This causes the infection. What increases the risk? You are more likely to get this condition if you have small pouches in your colon. The risk is higher if:  You are overweight or very overweight (obese).  You do not exercise enough.  You drink alcohol.  You smoke or use products with tobacco in them.  You eat a diet that has a lot of red meat such as beef, pork, or lamb.  You eat a diet that does not have enough fiber in it.  You are older than 51 years of age. What are the signs or symptoms?  Pain in the belly. Pain is often on the left side, but it may be in other areas.  Fever and feeling cold.  Feeling like you may vomit.  Vomiting.  Having cramps.  Feeling full.  Changes to how often you poop.  Blood in your poop. How is this treated? Most cases are treated at home by:  Taking over-the-counter pain medicines.  Following a clear liquid diet.  Taking antibiotic medicines.  Resting. Very bad cases may need to be treated at a hospital. This may include:  Not eating or drinking.  Taking prescription pain medicine.  Getting antibiotic medicines through an IV tube.  Getting fluid and food through an IV tube.  Having surgery. When you are feeling better, your doctor may tell you to have a test to check your colon (colonoscopy). Follow these instructions at home: Medicines  Take over-the-counter and prescription medicines only as told by your doctor. These include: ? Antibiotics. ? Pain medicines. ? Fiber  pills. ? Probiotics. ? Stool softeners.  If you were prescribed an antibiotic medicine, take it as told by your doctor. Do not stop taking the antibiotic even if you start to feel better.  Ask your doctor if the medicine prescribed to you requires you to avoid driving or using machinery. Eating and drinking  Follow a diet as told by your doctor.  When you feel better, your doctor may tell you to change your diet. You may need to eat a lot of fiber. Fiber makes it easier to poop (have a bowel movement). Foods with fiber include: ? Berries. ? Beans. ? Lentils. ? Green vegetables.  Avoid eating red meat.   General instructions  Do not use any products that contain nicotine or tobacco, such as cigarettes, e-cigarettes, and chewing tobacco. If you need help quitting, ask your doctor.  Exercise 3 or more times a week. Try to get 30 minutes each time. Exercise enough to sweat and make your heart beat faster.  Keep all follow-up visits as told by your doctor. This is important. Contact a doctor if:  Your pain does not get better.  You are not pooping like normal. Get help right away if:  Your pain gets worse.  Your symptoms do not get better.  Your symptoms get worse very fast.  You have a fever.  You vomit more than one time.  You have poop that is: ? Bloody. ? Black. ? Tarry. Summary  This condition happens when   small pouches in your colon get infected or swollen.  Take medicines only as told by your doctor.  Follow a diet as told by your doctor.  Keep all follow-up visits as told by your doctor. This is important. This information is not intended to replace advice given to you by your health care provider. Make sure you discuss any questions you have with your health care provider. Document Revised: 01/21/2019 Document Reviewed: 01/21/2019 Elsevier Patient Education  2021 Elsevier Inc.  

## 2020-08-28 ENCOUNTER — Encounter
Admission: RE | Disposition: A | Discharge status: Home / Self Care | Payer: Self-pay | Source: Home / Self Care | Attending: Gastroenterology

## 2020-08-28 ENCOUNTER — Ambulatory Visit: Payer: BC Managed Care – PPO | Admitting: Certified Registered"

## 2020-08-28 ENCOUNTER — Encounter: Payer: Self-pay | Admitting: Gastroenterology

## 2020-08-28 ENCOUNTER — Other Ambulatory Visit: Payer: Self-pay

## 2020-08-28 ENCOUNTER — Ambulatory Visit
Admission: RE | Admit: 2020-08-28 | Discharge: 2020-08-28 | Disposition: A | Discharge status: Home / Self Care | Payer: BC Managed Care – PPO | Attending: Gastroenterology | Admitting: Gastroenterology

## 2020-08-28 DIAGNOSIS — K635 Polyp of colon: Secondary | ICD-10-CM

## 2020-08-28 DIAGNOSIS — D122 Benign neoplasm of ascending colon: Secondary | ICD-10-CM | POA: Insufficient documentation

## 2020-08-28 DIAGNOSIS — Z87891 Personal history of nicotine dependence: Secondary | ICD-10-CM | POA: Diagnosis not present

## 2020-08-28 DIAGNOSIS — Z833 Family history of diabetes mellitus: Secondary | ICD-10-CM | POA: Insufficient documentation

## 2020-08-28 DIAGNOSIS — Z8349 Family history of other endocrine, nutritional and metabolic diseases: Secondary | ICD-10-CM | POA: Insufficient documentation

## 2020-08-28 DIAGNOSIS — Z887 Allergy status to serum and vaccine status: Secondary | ICD-10-CM | POA: Diagnosis not present

## 2020-08-28 DIAGNOSIS — Z8249 Family history of ischemic heart disease and other diseases of the circulatory system: Secondary | ICD-10-CM | POA: Diagnosis not present

## 2020-08-28 DIAGNOSIS — K5730 Diverticulosis of large intestine without perforation or abscess without bleeding: Secondary | ICD-10-CM | POA: Diagnosis not present

## 2020-08-28 DIAGNOSIS — D124 Benign neoplasm of descending colon: Secondary | ICD-10-CM | POA: Diagnosis not present

## 2020-08-28 DIAGNOSIS — Z8719 Personal history of other diseases of the digestive system: Secondary | ICD-10-CM

## 2020-08-28 DIAGNOSIS — K640 First degree hemorrhoids: Secondary | ICD-10-CM | POA: Diagnosis not present

## 2020-08-28 DIAGNOSIS — Z888 Allergy status to other drugs, medicaments and biological substances status: Secondary | ICD-10-CM | POA: Insufficient documentation

## 2020-08-28 DIAGNOSIS — Z803 Family history of malignant neoplasm of breast: Secondary | ICD-10-CM | POA: Diagnosis not present

## 2020-08-28 DIAGNOSIS — I10 Essential (primary) hypertension: Secondary | ICD-10-CM | POA: Diagnosis not present

## 2020-08-28 DIAGNOSIS — Z79899 Other long term (current) drug therapy: Secondary | ICD-10-CM | POA: Diagnosis not present

## 2020-08-28 DIAGNOSIS — Z7951 Long term (current) use of inhaled steroids: Secondary | ICD-10-CM | POA: Insufficient documentation

## 2020-08-28 HISTORY — PX: COLONOSCOPY WITH PROPOFOL: SHX5780

## 2020-08-28 HISTORY — DX: Essential (primary) hypertension: I10

## 2020-08-28 HISTORY — DX: Fatty (change of) liver, not elsewhere classified: K76.0

## 2020-08-28 LAB — POCT PREGNANCY, URINE: Preg Test, Ur: NEGATIVE

## 2020-08-28 SURGERY — COLONOSCOPY WITH PROPOFOL
Anesthesia: General

## 2020-08-28 MED ORDER — SODIUM CHLORIDE 0.9 % IV SOLN
INTRAVENOUS | Status: DC
Start: 2020-08-28 — End: 2020-08-28

## 2020-08-28 MED ORDER — PROPOFOL 10 MG/ML IV BOLUS
INTRAVENOUS | Status: DC | PRN
  Administered 2020-08-28: 20 mg via INTRAVENOUS
  Administered 2020-08-28: 30 mg via INTRAVENOUS
  Administered 2020-08-28: 70 mg via INTRAVENOUS
  Administered 2020-08-28: 30 mg via INTRAVENOUS

## 2020-08-28 MED ORDER — PROPOFOL 500 MG/50ML IV EMUL
INTRAVENOUS | Status: AC
  Filled 2020-08-28: qty 50

## 2020-08-28 MED ORDER — LIDOCAINE HCL (PF) 2 % IJ SOLN
INTRAMUSCULAR | Status: AC
  Filled 2020-08-28: qty 5

## 2020-08-28 MED ORDER — LIDOCAINE HCL (CARDIAC) PF 100 MG/5ML IV SOSY
PREFILLED_SYRINGE | INTRAVENOUS | Status: DC | PRN
  Administered 2020-08-28: 50 mg via INTRAVENOUS

## 2020-08-28 MED ORDER — PROPOFOL 10 MG/ML IV BOLUS
INTRAVENOUS | Status: AC
  Filled 2020-08-28: qty 20

## 2020-08-28 MED ORDER — PROPOFOL 500 MG/50ML IV EMUL
INTRAVENOUS | Status: DC | PRN
  Administered 2020-08-28: 150 ug/kg/min via INTRAVENOUS

## 2020-08-28 NOTE — Anesthesia Procedure Notes (Signed)
Procedure Name: MAC Date/Time: 08/28/2020 8:14 AM Performed by: Jerrye Noble, CRNA Pre-anesthesia Checklist: Patient identified, Emergency Drugs available, Suction available and Patient being monitored Patient Re-evaluated:Patient Re-evaluated prior to induction Oxygen Delivery Method: Nasal cannula

## 2020-08-28 NOTE — Anesthesia Postprocedure Evaluation (Signed)
Anesthesia Post Note  Patient: Meghan Osborne  Procedure(s) Performed: COLONOSCOPY WITH PROPOFOL (N/A )  Patient location during evaluation: PACU Anesthesia Type: General Level of consciousness: awake and alert Pain management: pain level controlled Vital Signs Assessment: post-procedure vital signs reviewed and stable Respiratory status: spontaneous breathing, nonlabored ventilation, respiratory function stable and patient connected to nasal cannula oxygen Cardiovascular status: blood pressure returned to baseline and stable Postop Assessment: no apparent nausea or vomiting Anesthetic complications: no   No complications documented.   Last Vitals:  Vitals:   08/28/20 0847 08/28/20 0857  BP: 103/67 110/70  Pulse: 90 80  Resp: (!) 29 (!) 22  Temp:    SpO2: 98% 98%    Last Pain:  Vitals:   08/28/20 0857  TempSrc:   PainSc: 0-No pain                 Molli Barrows

## 2020-08-28 NOTE — H&P (Signed)
Jonathon Bellows, MD [address] [place], [place], [place], [address] [address], [place], [place], [place], [address] Phone: [phone]  Fax: [phone]  Primary Care Physician:  Idelle Crouch, MD   Pre-Procedure History & Physical: HPI:  Meghan Osborne is a 50 y.o. female is here for an colonoscopy.   Past Medical History:  Diagnosis Date  . Allergies   . Asthma   . Fatty liver   . Hypertension     Past Surgical History:  Procedure Laterality Date  . VAGINAL DELIVERY     2    Prior to Admission medications   Medication Sig Start Date End Date Taking? Authorizing Provider  albuterol (PROAIR HFA) 108 (90 BASE) MCG/ACT inhaler Inhale 2 puffs into the lungs every 6 (six) hours as needed for wheezing. 12/26/13  Yes Jackolyn Confer, MD  azithromycin (ZITHROMAX) 250 MG tablet Take 2 tablets today, then 1 tablet daily for 4 days Patient not taking: No sig reported 01/07/14   Jearld Fenton, NP  famotidine (PEPCID) 20 MG tablet Take 1 tablet (20 mg total) by mouth daily. 06/28/19 06/27/20  Nance Pear, MD  sucralfate (CARAFATE) 1 g tablet Take 1 tablet (1 g total) by mouth 4 (four) times daily for 10 days. 06/28/19 07/08/19  Nance Pear, MD    Allergies as of 08/13/2020 - Review Complete 08/12/2020  Allergen Reaction Noted  . Oseltamivir Rash 04/12/2016  . Tamiflu [oseltamivir phosphate] Rash 11/28/2012    Family History  Problem Relation Age of Onset  . Heart disease Mother   . Alcohol abuse Mother   . Diabetes Father   . Hyperlipidemia Father   . Heart disease Father   . Cancer Other        breast, paternal great grandmother    Social History   Socioeconomic History  . Marital status: Married    Spouse name: Not on file  . Number of children: Not on file  . Years of education: Not on file  . Highest education level: Not on file  Occupational History  . Not on file  Tobacco Use  . Smoking status: Former Smoker    Types: Cigarettes     Quit date: 11/29/1991    Years since quitting: 28.7  . Smokeless tobacco: Never Used  Vaping Use  . Vaping Use: Never used  Substance and Sexual Activity  . Alcohol use: Yes    Comment: Socially  . Drug use: No  . Sexual activity: Not on file  Other Topics Concern  . Not on file  Social History Narrative   Lives in [place] with husband and daughter. Cat in home. 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      Work - Chemical engineer, Medical illustrator      Diet - regular      Exercise - limited   Social Determinants of Radio broadcast assistant Strain: Not on Art therapist Insecurity: Not on file  Transportation Needs: Not on file  Physical Activity: Not on file  Stress: Not on file  Social Connections: Not on file  Intimate Partner Violence: Not on file    Review of Systems: See HPI, otherwise negative ROS  Physical Exam: BP (!) 140/91   Pulse (!) 105   Temp (!) 97.5 F (36.4 C) (Temporal)   Resp 20   Ht 5' 3.5" (1.613 m)   Wt 97.5 kg   SpO2 100%   BMI 37.49 kg/m  General:   Alert,  pleasant and cooperative in NAD Head:  Normocephalic and atraumatic. Neck:  Supple; no masses or thyromegaly. Lungs:  Clear throughout to auscultation, normal respiratory effort.    Heart:  +S1, +S2, Regular rate and rhythm, No edema. Abdomen:  Soft, nontender and nondistended. Normal bowel sounds, without guarding, and without rebound.   Neurologic:  Alert and  oriented x4;  grossly normal neurologically.  Impression/Plan: Meghan Osborne is here for an colonoscopy to be performed for diverticulitis Risks, benefits, limitations, and alternatives regarding  colonoscopy have been reviewed with the patient.  Questions have been answered.  All parties agreeable.   Jonathon Bellows, MD  08/28/2020, 8:04 AM

## 2020-08-28 NOTE — Op Note (Signed)
[facility] Gastroenterology Patient Name: Meghan Osborne Procedure Date: 08/28/2020 8:06 AM MRN: [id number] Account #: [id number] Date of Birth: April 26, 1970 Admit Type: Outpatient Age: 50 Room: [facility] ENDO ROOM 4 Gender: Female Note Status: Finalized Procedure:             Colonoscopy Indications:           Follow-up of diverticulitis Providers:             Jonathon Bellows MD, MD Referring MD:          Leonie Douglas. Doy Hutching, MD (Referring MD) Medicines:             Monitored Anesthesia Care Complications:         No immediate complications. Procedure:             Pre-Anesthesia Assessment:                        - Prior to the procedure, a History and Physical was                         performed, and patient medications, allergies and                         sensitivities were reviewed. The patient's tolerance                         of previous anesthesia was reviewed.                        - The risks and benefits of the procedure and the                         sedation options and risks were discussed with the                         patient. All questions were answered and informed                         consent was obtained.                        - ASA Grade Assessment: II - A patient with mild                         systemic disease.                        After obtaining informed consent, the colonoscope was                         passed under direct vision. Throughout the procedure,                         the patient's blood pressure, pulse, and oxygen                         saturations were monitored continuously. The                         Colonoscope was introduced through the anus and  advanced to the the cecum, identified by the                         appendiceal orifice. The colonoscopy was performed                         with ease. The patient tolerated the procedure well.                         The quality of the bowel  preparation was good. Findings:      The perianal and digital rectal examinations were normal.      Multiple small-mouthed diverticula were found in the sigmoid colon.      Non-bleeding internal hemorrhoids were found during retroflexion. The       hemorrhoids were medium-sized and Grade I (internal hemorrhoids that do       not prolapse).      Two sessile polyps were found in the ascending colon. The polyps were 5       to 7 mm in size. These polyps were removed with a cold snare. Resection       and retrieval were complete.      A 5 mm polyp was found in the descending colon. The polyp was sessile.       The polyp was removed with a cold snare. Resection and retrieval were       complete.      The exam was otherwise without abnormality on direct and retroflexion       views. Impression:            - Diverticulosis in the sigmoid colon.                        - Non-bleeding internal hemorrhoids.                        - Two 5 to 7 mm polyps in the ascending colon, removed                         with a cold snare. Resected and retrieved.                        - One 5 mm polyp in the descending colon, removed with                         a cold snare. Resected and retrieved.                        - The examination was otherwise normal on direct and                         retroflexion views. Recommendation:        - Discharge patient to home (with escort).                        - Resume previous diet.                        - Continue present medications.                        -  Await pathology results.                        - Repeat colonoscopy for surveillance based on                         pathology results. Procedure Code(s):     --- Professional ---                        [id number], Colonoscopy, flexible; with removal of                         tumor(s), polyp(s), or other lesion(s) by snare                         technique Diagnosis Code(s):     --- Professional ---                         K64.0, First degree hemorrhoids                        K63.5, Polyp of colon                        K57.32, Diverticulitis of large intestine without                         perforation or abscess without bleeding                        K57.30, Diverticulosis of large intestine without                         perforation or abscess without bleeding CPT copyright 2019 American Medical Association. All rights reserved. The codes documented in this report are preliminary and upon coder review may  be revised to meet current compliance requirements. Jonathon Bellows, MD Jonathon Bellows MD, MD 08/28/2020 8:37:03 AM This report has been signed electronically. Number of Addenda: 0 Note Initiated On: 08/28/2020 8:06 AM Scope Withdrawal Time: 0 hours 12 minutes 29 seconds  Total Procedure Duration: 0 hours 15 minutes 51 seconds  Estimated Blood Loss:  Estimated blood loss: none.      [facility]

## 2020-08-28 NOTE — Anesthesia Preprocedure Evaluation (Signed)
Anesthesia Evaluation  Patient identified by MRN, date of birth, ID band Patient awake    Reviewed: Allergy & Precautions, H&P , NPO status , Patient's Chart, lab work & pertinent test results, reviewed documented beta blocker date and time   Airway Mallampati: II   Neck ROM: full    Dental  (+) Poor Dentition   Pulmonary neg pulmonary ROS, asthma , former smoker,    Pulmonary exam normal        Cardiovascular Exercise Tolerance: Good hypertension, On Medications negative cardio ROS Normal cardiovascular exam Rhythm:regular Rate:Normal     Neuro/Psych negative neurological ROS  negative psych ROS   GI/Hepatic negative GI ROS, Neg liver ROS,   Endo/Other  negative endocrine ROS  Renal/GU negative Renal ROS  negative genitourinary   Musculoskeletal   Abdominal   Peds  Hematology negative hematology ROS (+)   Anesthesia Other Findings Past Medical History: No date: Allergies No date: Asthma No date: Fatty liver No date: Hypertension Past Surgical History: No date: VAGINAL DELIVERY     Comment:  2 BMI    Body Mass Index: 37.49 kg/m     Reproductive/Obstetrics negative OB ROS                             Anesthesia Physical Anesthesia Plan  ASA: II  Anesthesia Plan: General   Post-op Pain Management:    Induction:   PONV Risk Score and Plan:   Airway Management Planned:   Additional Equipment:   Intra-op Plan:   Post-operative Plan:   Informed Consent: I have reviewed the patients History and Physical, chart, labs and discussed the procedure including the risks, benefits and alternatives for the proposed anesthesia with the patient or authorized representative who has indicated his/her understanding and acceptance.     Dental Advisory Given  Plan Discussed with: CRNA  Anesthesia Plan Comments:         Anesthesia Quick Evaluation

## 2020-08-28 NOTE — Transfer of Care (Signed)
Immediate Anesthesia Transfer of Care Note  Patient: Meghan Osborne  Procedure(s) Performed: COLONOSCOPY WITH PROPOFOL (N/A )  Patient Location: PACU and Endoscopy Unit  Anesthesia Type:General  Level of Consciousness: awake, drowsy and patient cooperative  Airway & Oxygen Therapy: Patient Spontanous Breathing  Post-op Assessment: Report given to RN and Post -op Vital signs reviewed and stable  Post vital signs: Reviewed  Last Vitals:  Vitals Value Taken Time  BP 102/74 08/28/20 0837  Temp    Pulse 87 08/28/20 0837  Resp 19 08/28/20 0837  SpO2 96 % 08/28/20 0837    Last Pain:  Vitals:   08/28/20 0837  TempSrc:   PainSc: 0-No pain         Complications: No complications documented.

## 2020-08-31 ENCOUNTER — Encounter: Payer: Self-pay | Admitting: Gastroenterology

## 2020-08-31 LAB — SURGICAL PATHOLOGY

## 2020-09-01 ENCOUNTER — Encounter: Payer: Self-pay | Admitting: Gastroenterology

## 2021-01-06 ENCOUNTER — Other Ambulatory Visit: Payer: Self-pay

## 2021-01-06 ENCOUNTER — Ambulatory Visit: Payer: BC Managed Care – PPO | Admitting: Dermatology

## 2021-01-06 DIAGNOSIS — L308 Other specified dermatitis: Secondary | ICD-10-CM

## 2021-01-06 DIAGNOSIS — I872 Venous insufficiency (chronic) (peripheral): Secondary | ICD-10-CM | POA: Diagnosis not present

## 2021-01-06 DIAGNOSIS — L821 Other seborrheic keratosis: Secondary | ICD-10-CM

## 2021-01-06 MED ORDER — EUCRISA 2 % EX OINT: 1.0000 "application " | TOPICAL_OINTMENT | Freq: Two times a day (BID) | CUTANEOUS | 3 refills | Status: AC

## 2021-01-06 NOTE — Patient Instructions (Signed)

## 2021-01-06 NOTE — Progress Notes (Signed)
   Follow-Up Visit   Subjective  Meghan Osborne is a 50 y.o. female who presents for the following: Rash (Right lower x ~7 months very itch and was prescribed TMC 0.5% cream and a spot of left back).  The following portions of the chart were reviewed this encounter and updated as appropriate:   Tobacco  Allergies  Meds  Problems  Med Hx  Surg Hx  Fam Hx     Review of Systems:  No other skin or systemic complaints except as noted in HPI or Assessment and Plan.  Objective  Well appearing patient in no apparent distress; mood and affect are within normal limits.  A focused examination was performed including legs, back. Relevant physical exam findings are noted in the Assessment and Plan.  Right pretibial Erythema and mild lichenification       Back Stuck-on, waxy, tan-brown papule or plaque --Discussed benign etiology and prognosis.    Assessment & Plan  Other eczema with stasis changes Right pretibial Possibly exacerbated by stasis dermatitis  D/C TMC.  Start Eucrisa oint bid. Recommend moisturizer daily.  May consider Opzelura cream in the future.  Recommend compression socks to decrease swelling. May consider evaluation by vascular surgeon.  Atopic dermatitis (eczema) is a chronic, relapsing, pruritic condition that can significantly affect quality of life. It is often associated with allergic rhinitis and/or asthma and can require treatment with topical medications, phototherapy, or in severe cases a biologic medication called Dupixent in children and adults.   Stasis in the legs causes chronic leg swelling, which may result in itchy or painful rashes, skin discoloration, skin texture changes, and sometimes ulceration.  Recommend daily compression hose/stockings- easiest to put on first thing in morning, remove at bedtime.  Elevate legs as much as possible. Avoid salt/sodium rich foods.  Crisaborole (EUCRISA) 2 % OINT - Right pretibial Apply 1 application  topically in the morning and at bedtime.  Seborrheic keratosis Back Benign-appearing.  Observation.  Call clinic for new or changing lesions.  Recommend daily use of broad spectrum spf 30+ sunscreen to sun-exposed areas.   Return in about 3 months (around 04/07/2021).  I, Ashok Cordia, CMA, am acting as scribe for Sarina Ser, MD . Documentation: I have reviewed the above documentation for accuracy and completeness, and I agree with the above.  Sarina Ser, MD

## 2021-01-09 ENCOUNTER — Encounter: Payer: Self-pay | Admitting: Dermatology

## 2021-04-07 ENCOUNTER — Ambulatory Visit: Payer: BC Managed Care – PPO | Admitting: Dermatology

## 2021-07-12 ENCOUNTER — Ambulatory Visit: Payer: BC Managed Care – PPO | Admitting: Dermatology

## 2021-08-18 ENCOUNTER — Ambulatory Visit: Payer: BC Managed Care – PPO | Admitting: Dermatology

## 2021-12-07 IMAGING — CT CT ABD-PELV W/ CM
2 of 5 series · 17 of 46 positions shown, 19 images · IV contrast (omnipaque)
Comparison: Abdominal ultrasound 07/17/2019

CLINICAL DATA: Left lower quadrant abdominal pain for 2 days.

EXAM:
CT ABDOMEN AND PELVIS WITH CONTRAST
TECHNIQUE: Multidetector CT imaging of the abdomen and pelvis was performed
using the standard protocol following bolus administration of
intravenous contrast.
CONTRAST:  100mL OMNIPAQUE IOHEXOL 300 MG/ML  SOLN

[Series 2: routine abd/pel with · axial · 0.91mm/px · z∈[-539,-99]mm · 14 of 100 slices shown, 16 images]
[im 6/100  soft-tissue]
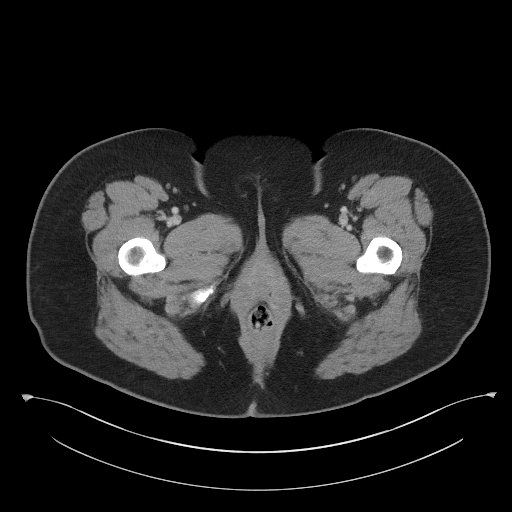
[im 6/100  bone]
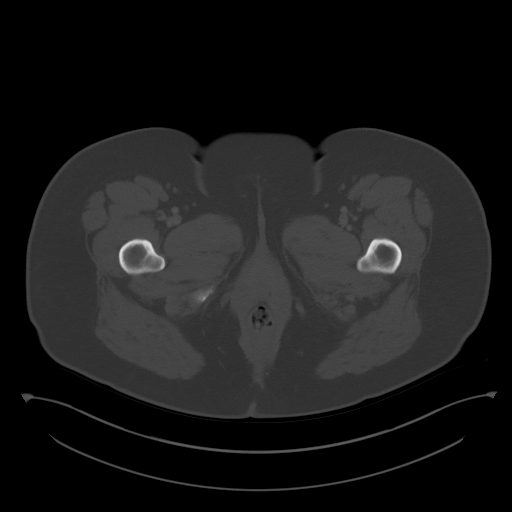
[im 11/100  soft-tissue]
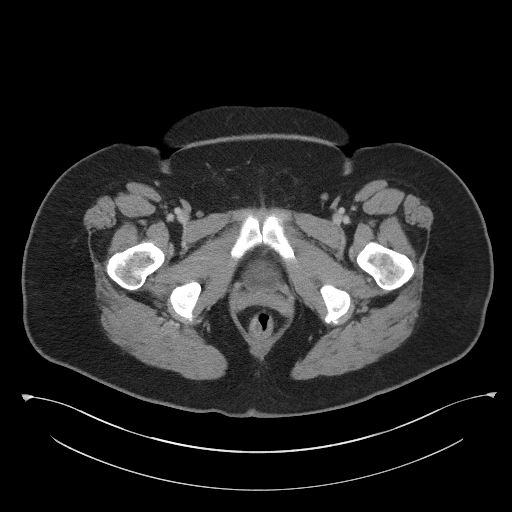
[im 21/100  soft-tissue]
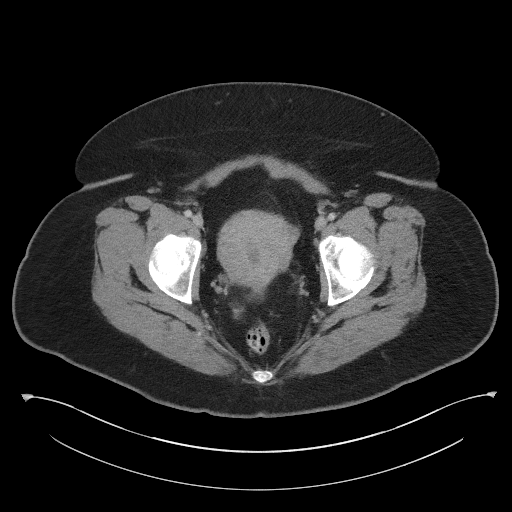
[im 27/100  soft-tissue]
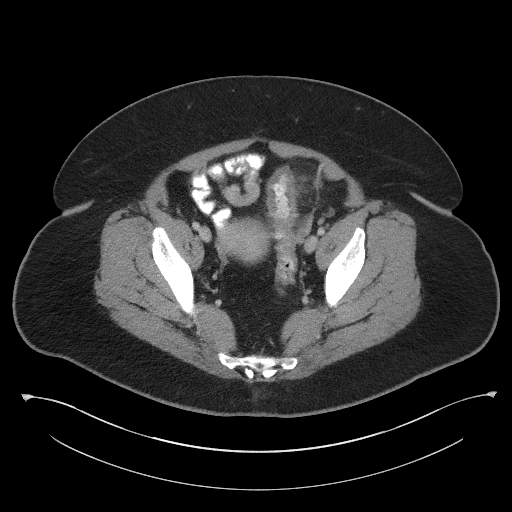
[im 32/100  soft-tissue]
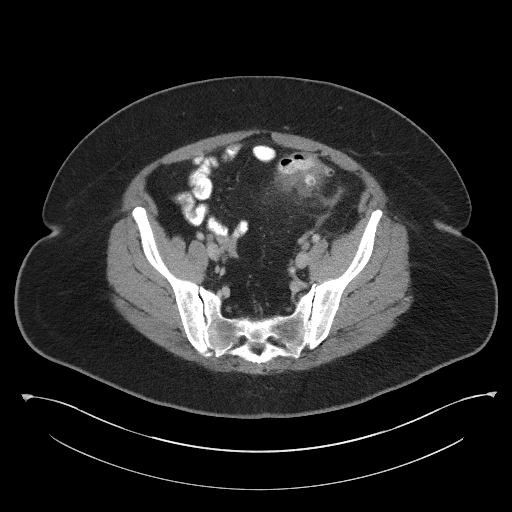
[im 42/100  soft-tissue]
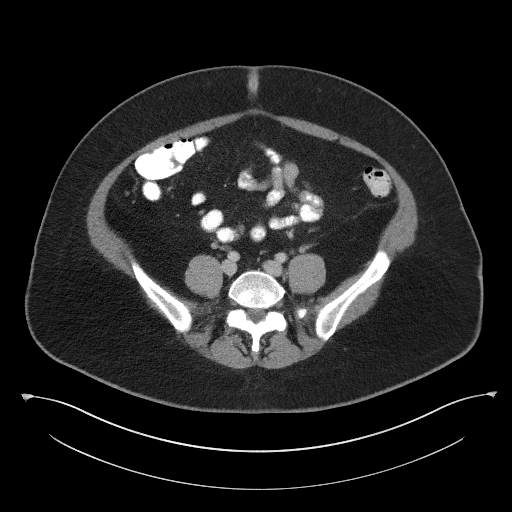
[im 47/100  soft-tissue]
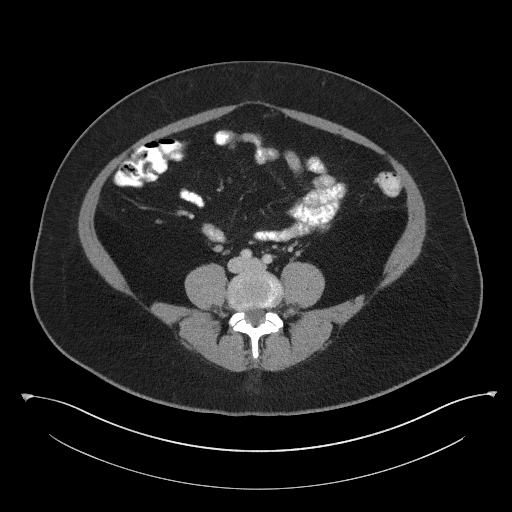
[im 53/100  soft-tissue]
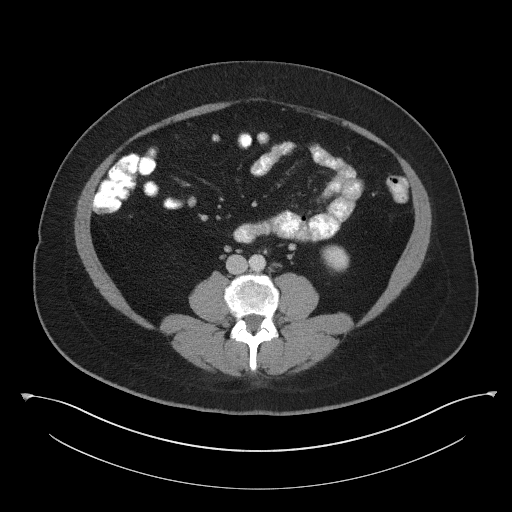
[im 58/100  soft-tissue]
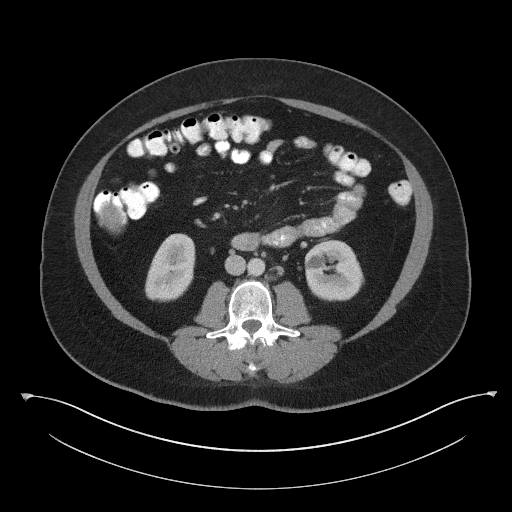
[im 58/100  bone]
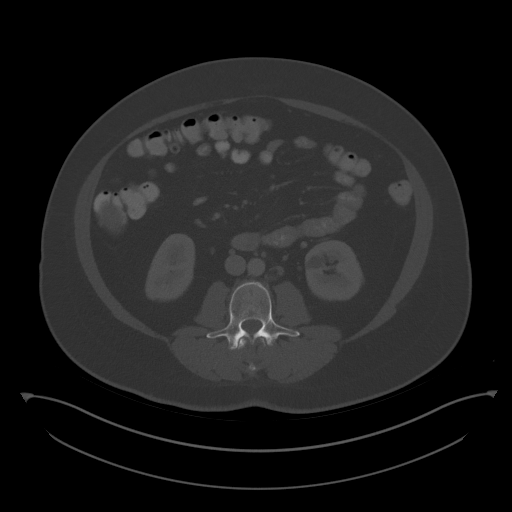
[im 68/100  soft-tissue]
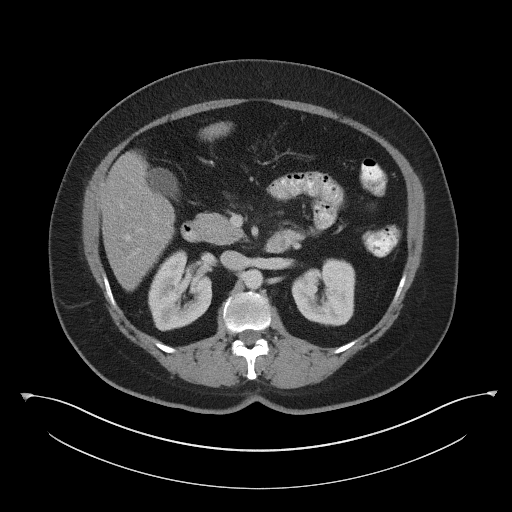
[im 73/100  soft-tissue]
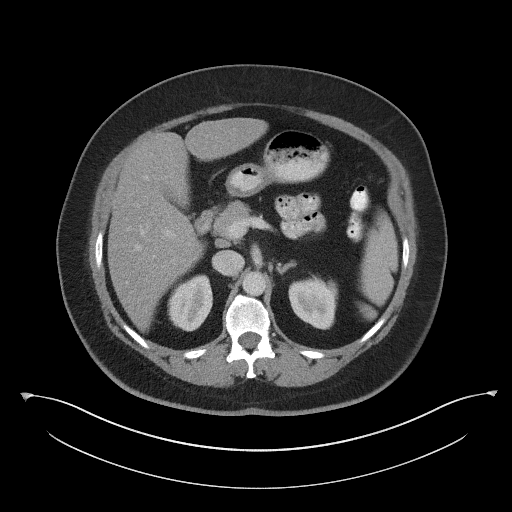
[im 79/100  soft-tissue]
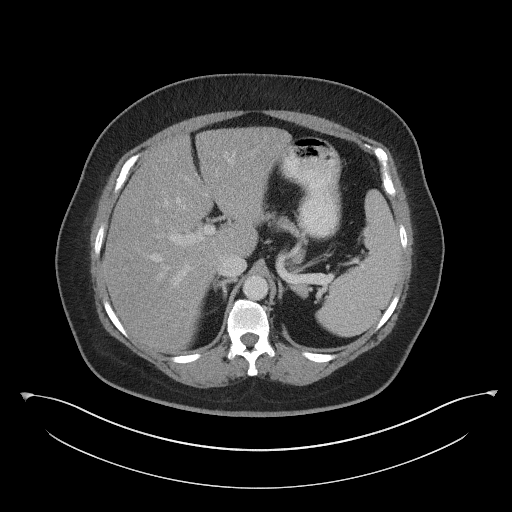
[im 89/100  soft-tissue]
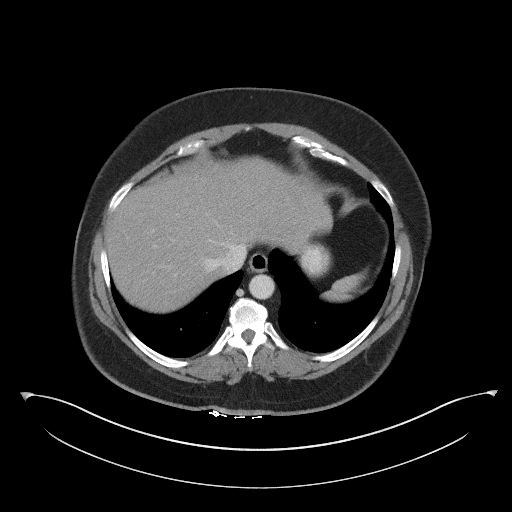
[im 94/100  soft-tissue]
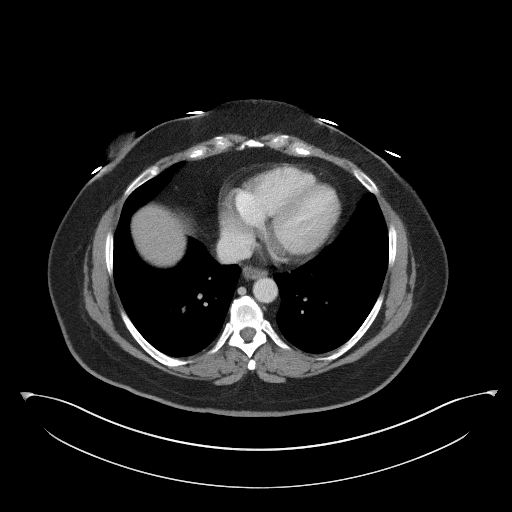

[Series 5: coronal st · coronal · 0.87mm/px · 3 of 102 slices shown]
[im 34/102  soft-tissue]
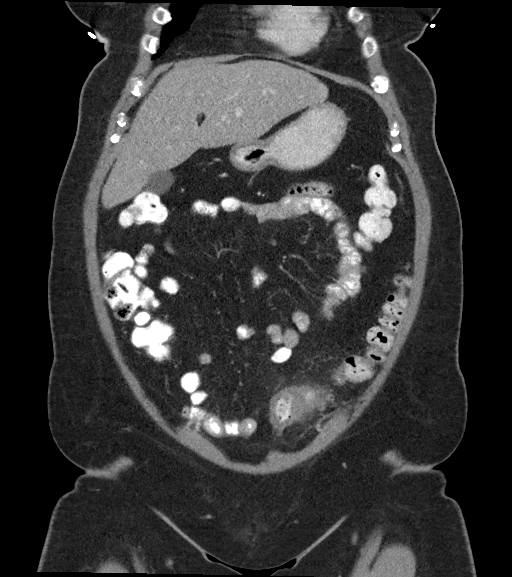
[im 45/102  soft-tissue]
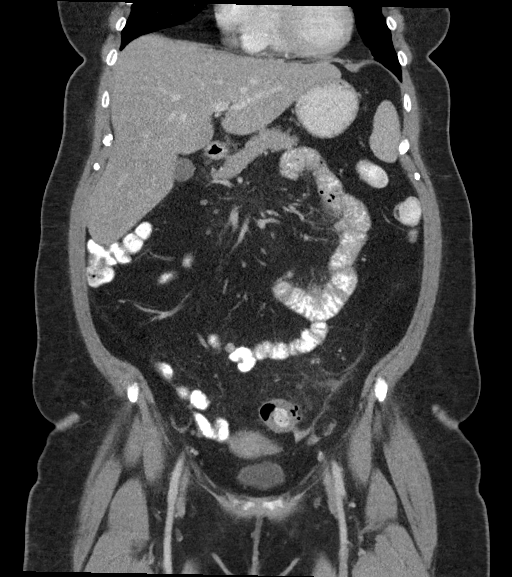
[im 57/102  soft-tissue]
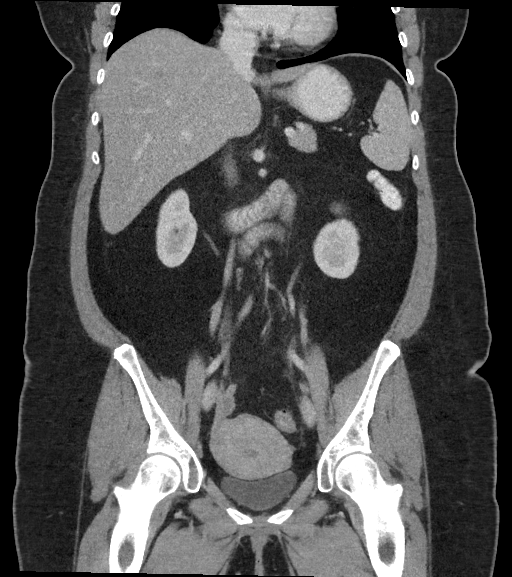

[17 of 46 positions shown; findings below may reference images not displayed]

FINDINGS: Lower chest: Small Bochdalek's hernia containing adipose tissue

Hepatobiliary: Unremarkable

Pancreas: Unremarkable

Spleen: Unremarkable

Adrenals/Urinary Tract: 2 mm right mid kidney nonobstructive renal
calculus, image 73 series 5. Hypodense 1.2 by 1.1 by 0.9 cm lesion
in the left kidney lower pole is probably a cyst but technically too
small to characterize. Adrenal glands normal.

Stomach/Bowel: Moderate acute sigmoid colon diverticulitis, with an
inflamed diverticulum in surrounding edema shown on image 69 of
series 2. No extraluminal gas or abscess. Wall thickening in the
sigmoid colon in general although more striking in the vicinity of
the inflammation. Normal appendix.

Vascular/Lymphatic: Unremarkable

Reproductive: Unremarkable

Other: No supplemental non-categorized findings.

Musculoskeletal: Unremarkable
IMPRESSION: 1. Moderate acute sigmoid colon diverticulitis. No extraluminal gas
or abscess.
2. 2 mm right mid kidney nonobstructive renal calculus.
3. Hypodense lesion in the left kidney lower pole is probably a cyst
but technically too small to characterize.
